# Patient Record
Sex: Male | Born: 1980 | Race: Asian | Hispanic: No | Marital: Married | Smoking: Never smoker
Health system: Southern US, Community
[De-identification: ages and names within clinical notes are randomized; demographics above are authoritative.]

---

## 2015-09-11 ENCOUNTER — Ambulatory Visit (INDEPENDENT_AMBULATORY_CARE_PROVIDER_SITE_OTHER): Payer: BLUE CROSS/BLUE SHIELD

## 2015-09-11 ENCOUNTER — Ambulatory Visit (INDEPENDENT_AMBULATORY_CARE_PROVIDER_SITE_OTHER): Payer: BLUE CROSS/BLUE SHIELD | Admitting: Family Medicine

## 2015-09-11 VITALS — BP 109/69 | HR 64 | Temp 98.2°F | Resp 18 | Ht 69.0 in | Wt 153.0 lb

## 2015-09-11 DIAGNOSIS — S9032XA Contusion of left foot, initial encounter: Secondary | ICD-10-CM | POA: Diagnosis not present

## 2015-09-11 NOTE — Progress Notes (Signed)
This chart was scribed for Elvina Sidle, MD by Stann Ore, medical scribe at [facility].The patient was seen in exam room 6 and the patient's care was started at 8:12 PM.  Patient ID: Brett Padilla MRN: [id number], DOB: 02/07/1981, 34 y.o. Date of Encounter: 09/11/2015  Primary Physician: No primary care provider on file.  Chief Complaint:  Chief Complaint  Patient presents with   Foot Injury    C/O left foot injury around 6:30 this evening-hit foot on post while on the lawnmower   Flu Vaccine    HPI:  Brett Padilla is a 34 y.o. male who presents to [facility] complaining of left foot injury around 6:30 PM. He was riding a lawnmower and hit his left foot on a post. He can stand on it. It also stings a little bit but he can walk.   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   No past medical history on file.   Home Meds: Prior to Admission medications   Not on File    Allergies: No Known Allergies  Social History   Social History   Marital Status: Married    Spouse Name: N/A   Number of Children: N/A   Years of Education: N/A   Occupational History   Not on file.   Social History Main Topics   Smoking status: Never Smoker    Smokeless tobacco: Not on file   Alcohol Use: Not on file   Drug Use: No   Sexual Activity: Not on file   Other Topics Concern   Not on file   Social History Narrative   No narrative on file     Review of Systems: Constitutional: negative for chills, fever, night sweats, weight changes, or fatigue  HEENT: negative for vision changes, hearing loss, congestion, rhinorrhea, ST, epistaxis, or sinus pressure Cardiovascular: negative for chest pain or palpitations Respiratory: negative for hemoptysis, wheezing, shortness of breath, or cough Abdominal: negative for abdominal pain, nausea, vomiting, diarrhea, or constipation Dermatological: positive for wound Neurologic: negative for  headache, dizziness, or syncope All other systems reviewed and are otherwise negative with the exception to those above and in the HPI.  Physical Exam: Blood pressure 109/69, pulse 64, temperature 98.2 F (36.8 C), temperature source Oral, resp. rate 18, height  (1.753 m), weight 153 lb (69.4 kg), SpO2 98 %., Body mass index is 22.58 kg/(m^2). General: Well developed, well nourished, in no acute distress. Head: Normocephalic, atraumatic, eyes without discharge, sclera non-icteric, nares are without discharge. Bilateral auditory canals clear, TM's are without perforation, pearly grey and translucent with reflective cone of light bilaterally. Oral cavity moist, posterior pharynx without exudate, erythema, peritonsillar abscess, or post nasal drip.  Neck: Supple. No thyromegaly. Full ROM. No lymphadenopathy. Lungs: Clear bilaterally to auscultation without wheezes, rales, or rhonchi. Breathing is unlabored. Heart: RRR with S1 S2. No murmurs, rubs, or gallops appreciated. Abdomen: Soft, non-tender, non-distended with normoactive bowel sounds. No hepatomegaly. No rebound/guarding. No obvious abdominal masses. Msk:  Strength and tone normal for age. Extremities/Skin: Warm and dry. No clubbing or cyanosis. Swelling over dorsal foot on big toe side with overlying 2cm abrasion Neuro: Alert and oriented X 3. Moves all extremities spontaneously. Gait is normal. CNII-XII grossly in tact. Psych:  Responds to questions appropriately with a normal affect.   Labs: [facility] reading (PRIMARY) by Dr. Milus Glazier : x-ray, left foot: no fracture   ASSESSMENT AND PLAN:  34 y.o. year old  male with contusion and abrasion left foot  Wash well, apply antibiotic ointment daily, return for any infection signs  This chart was scribed in my presence and reviewed by me personally.    ICD-9-CM ICD-10-CM   1. Foot contusion, left, initial encounter 924.20 S90.32XA DG Foot Complete Left    By signing my name below, I,  Stann Ore, attest that this documentation has been prepared under the direction and in the presence of Elvina Sidle, MD. Electronically Signed: Stann Ore, Scribe. 09/11/2015 , 8:14 PM .  Signed, Elvina Sidle, MD 09/11/2015 8:14 PM

## 2015-09-11 NOTE — Patient Instructions (Signed)
Keep the foot washed with soap and water daily followed by antibiotic ointment and cover with nonstick dressing. Usually this will remain swollen for up to a week with some discomfort when walking. Nevertheless I think you can go back to work tomorrow.  Please return if you see increasing redness, swelling, or pain.

## 2016-09-11 DIAGNOSIS — Z23 Encounter for immunization: Secondary | ICD-10-CM | POA: Diagnosis not present

## 2016-09-25 IMAGING — CR DG FOOT COMPLETE 3+V*L*
3 series · 3 of 3 positions shown · non-contrast
Comparison: None.

CLINICAL DATA: Status post foot injury today with left foot pain.

EXAM:
LEFT FOOT - COMPLETE 3+ VIEW

[AP]
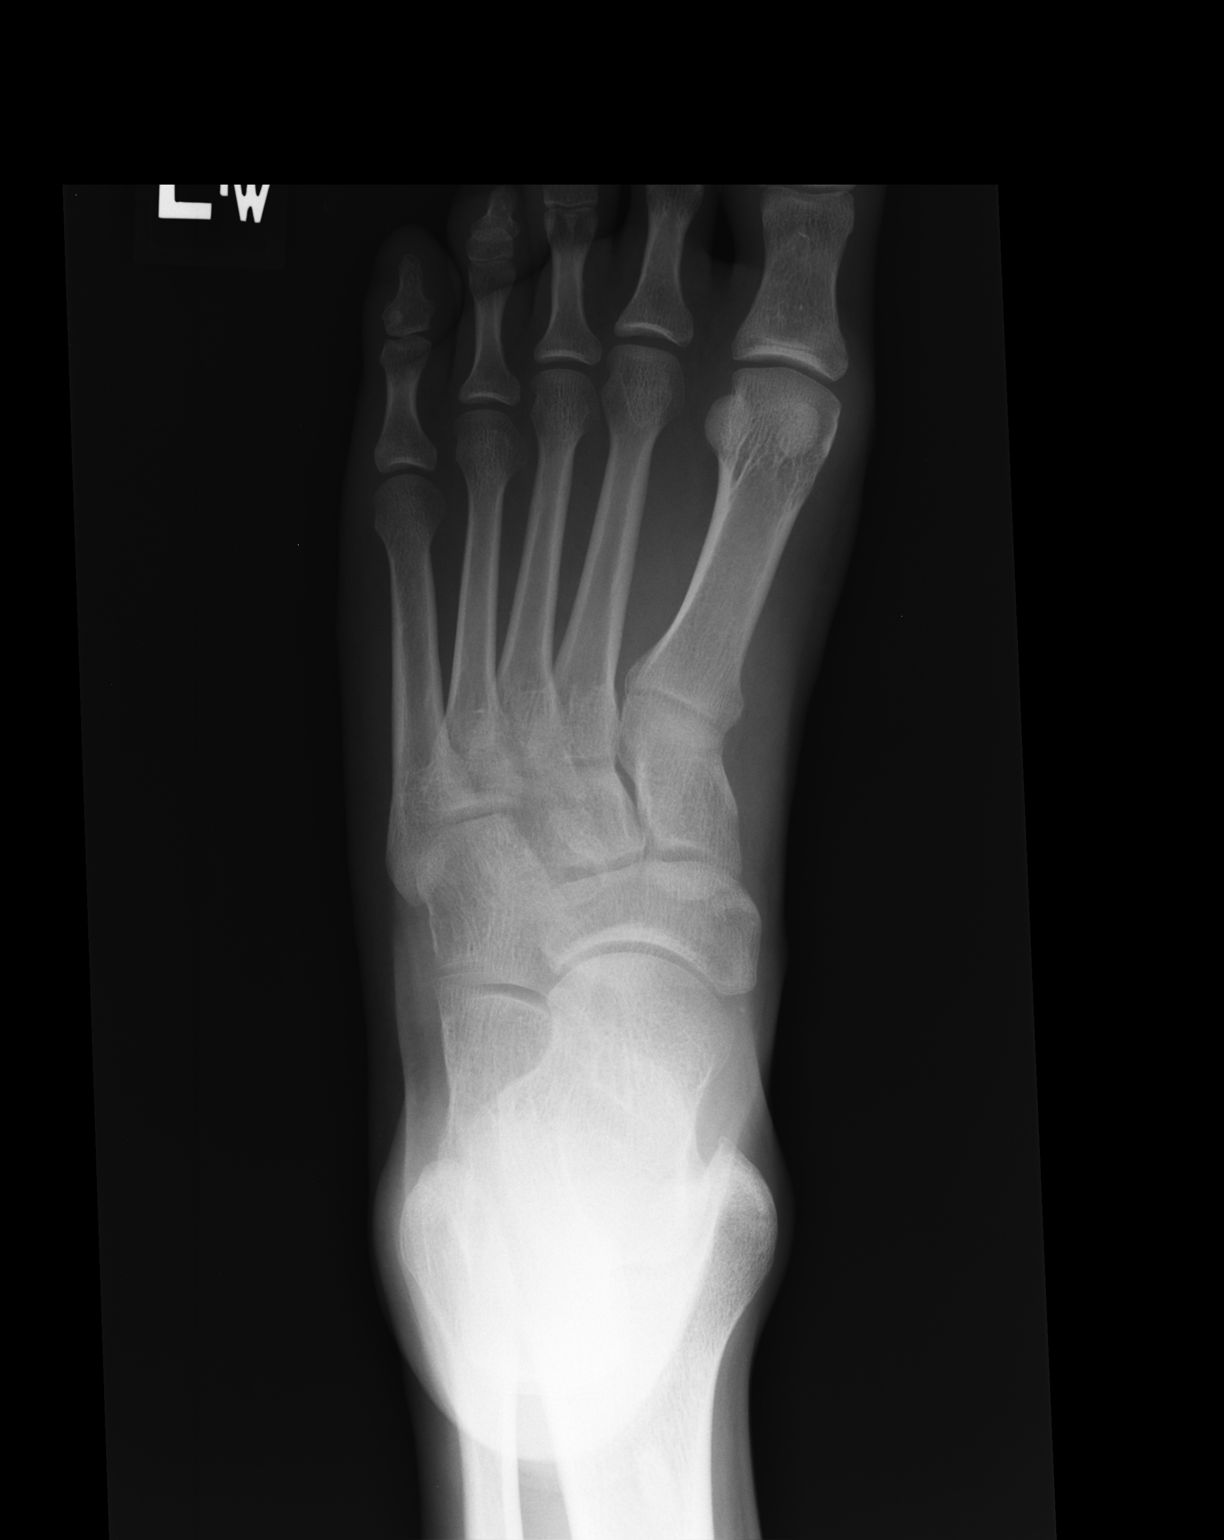

[ap obl int rot]
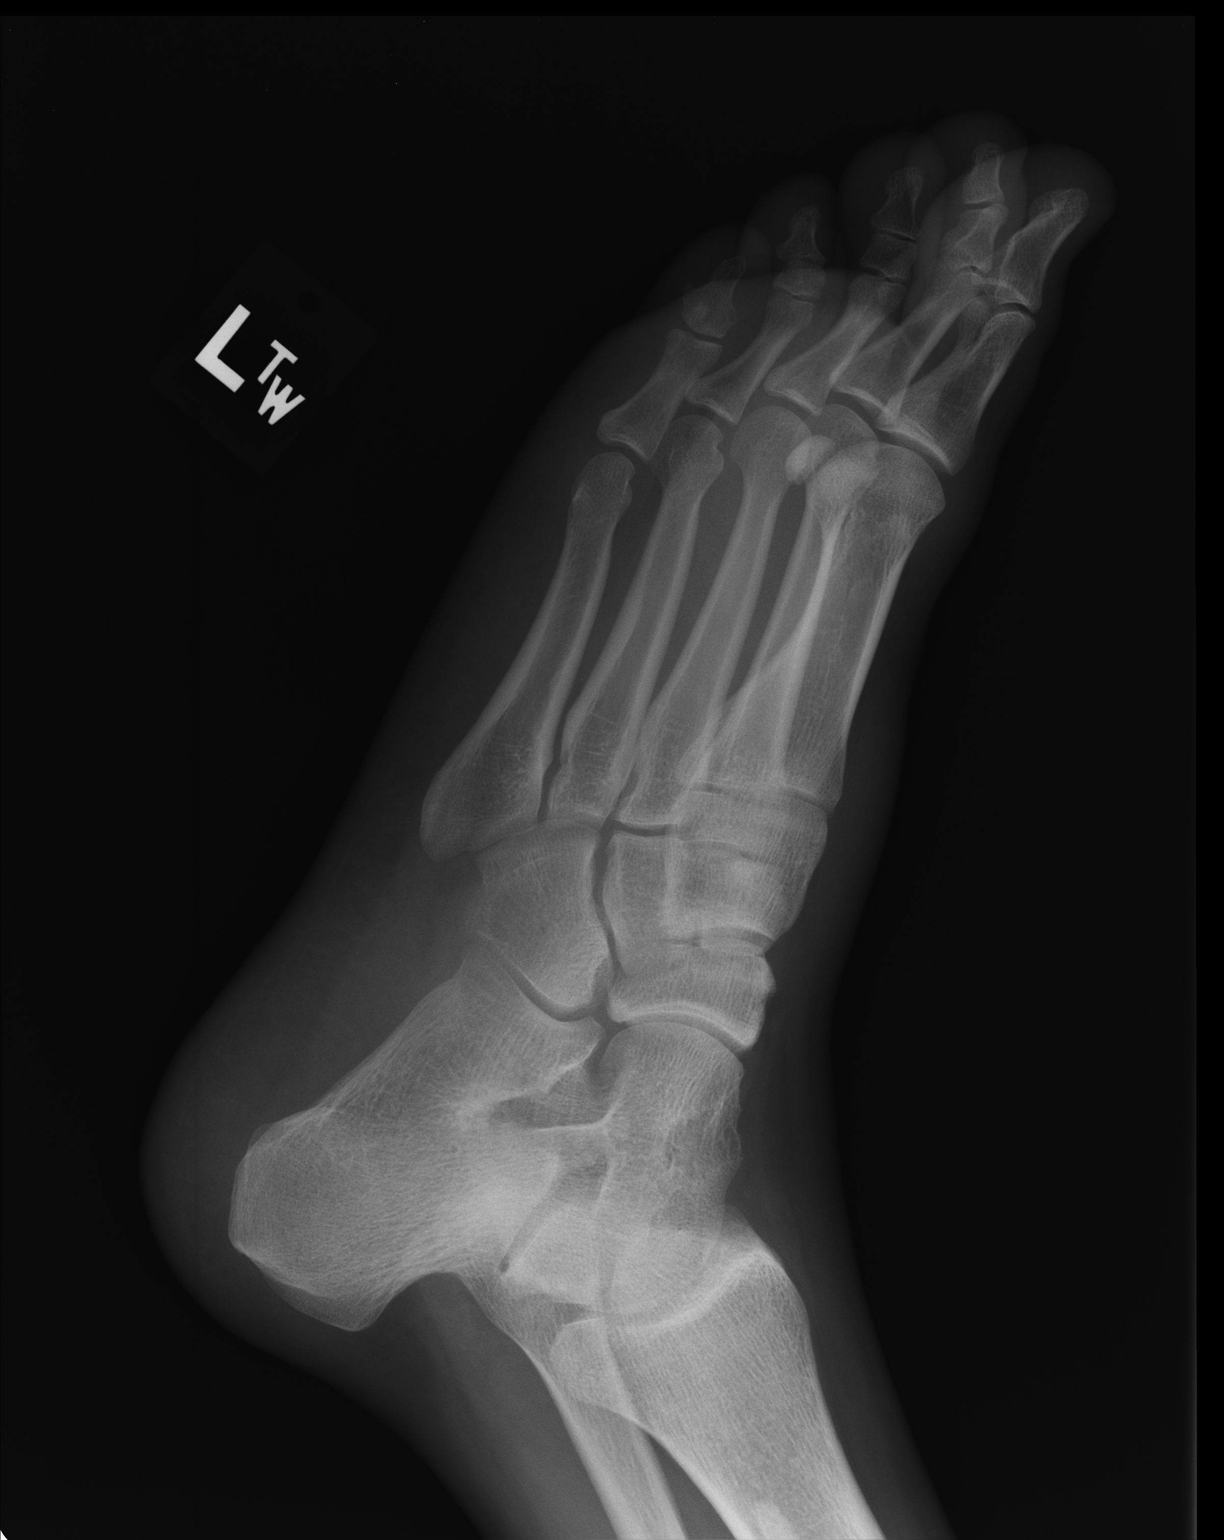

[lateral]
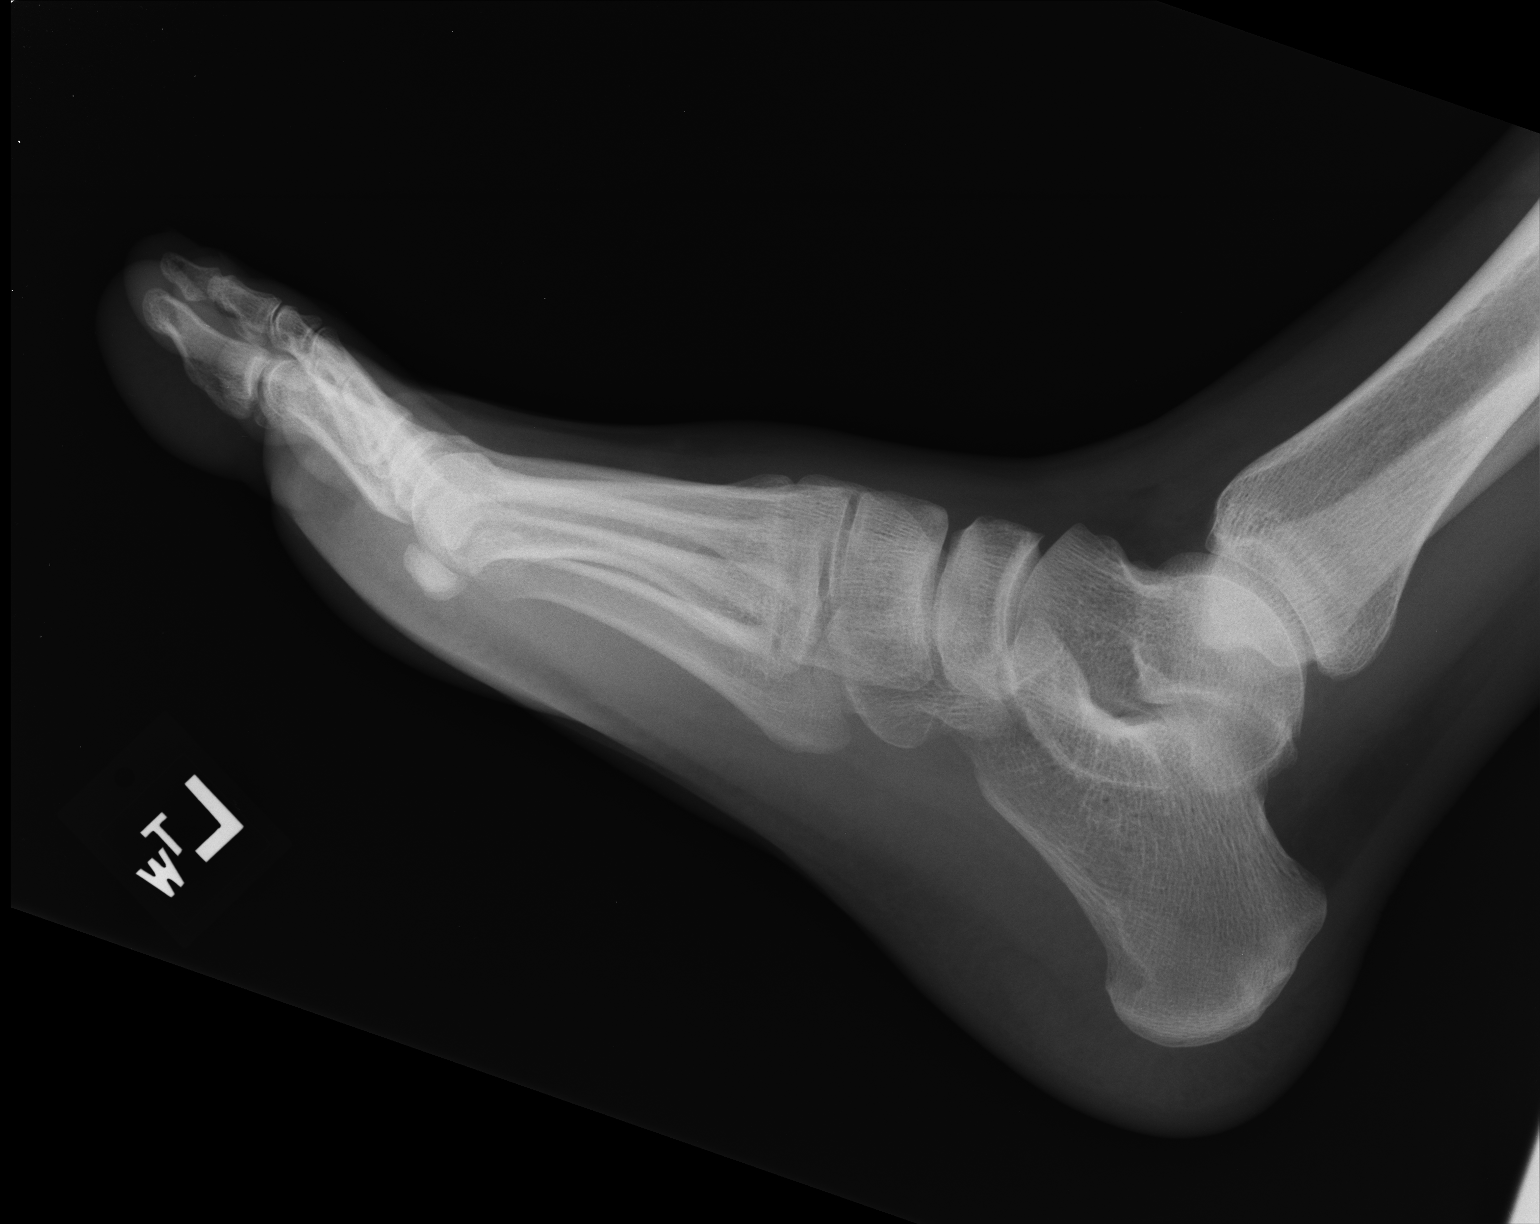

[3 of 3 positions shown; findings below may reference images not displayed]

FINDINGS: There is no evidence of fracture or dislocation. There is no
evidence of arthropathy or other focal bone abnormality. Soft
tissues are unremarkable.
IMPRESSION: Negative.

## 2017-02-27 ENCOUNTER — Ambulatory Visit: Payer: BLUE CROSS/BLUE SHIELD

## 2017-02-27 ENCOUNTER — Ambulatory Visit (INDEPENDENT_AMBULATORY_CARE_PROVIDER_SITE_OTHER): Payer: BLUE CROSS/BLUE SHIELD | Admitting: Emergency Medicine

## 2017-02-27 VITALS — BP 100/80 | HR 108 | Temp 99.0°F | Ht 69.1 in | Wt 159.0 lb

## 2017-02-27 DIAGNOSIS — R070 Pain in throat: Secondary | ICD-10-CM | POA: Diagnosis not present

## 2017-02-27 DIAGNOSIS — J020 Streptococcal pharyngitis: Secondary | ICD-10-CM | POA: Diagnosis not present

## 2017-02-27 DIAGNOSIS — J028 Acute pharyngitis due to other specified organisms: Secondary | ICD-10-CM | POA: Insufficient documentation

## 2017-02-27 LAB — POCT RAPID STREP A (OFFICE): Rapid Strep A Screen: POSITIVE — AB

## 2017-02-27 MED ORDER — AZITHROMYCIN 250 MG PO TABS: ORAL_TABLET | ORAL | 0 refills | Status: AC

## 2017-02-27 NOTE — Patient Instructions (Addendum)
IF you received an x-ray today, you will receive an invoice from [facility]. Please contact [facility] at [phone] with questions or concerns regarding your invoice.   IF you received labwork today, you will receive an invoice from [place]. Please contact [facility] at [phone] with questions or concerns regarding your invoice.   Our billing staff will not be able to assist you with questions regarding bills from these companies.  You will be contacted with the lab results as soon as they are available. The fastest way to get your results is to activate your My Chart account. Instructions are located on the last page of this paperwork. If you have not heard from [place] regarding the results in 2 weeks, please contact this office.      Sore Throat When you have a sore throat, your throat may:  Hurt.  Burn.  Feel irritated.  Feel scratchy. Many things can cause a sore throat, including:  An infection.  Allergies.  Dryness in the air.  Smoke or pollution.  Gastroesophageal reflux disease (GERD).  A tumor. A sore throat can be the first sign of another sickness. It can happen with other problems, like coughing or a fever. Most sore throats go away without treatment. Follow these instructions at home:  Take over-the-counter medicines only as told by your doctor.  Drink enough fluids to keep your pee (urine) clear or pale yellow.  Rest when you feel you need to.  To help with pain, try:  Sipping warm liquids, such as broth, herbal tea, or warm water.  Eating or drinking cold or frozen liquids, such as frozen ice pops.  Gargling with a salt-water mixture 3-4 times a day or as needed. To make a salt-water mixture, add -1 tsp of salt in 1 cup of warm water. Mix it until you cannot see the salt anymore.  Sucking on hard candy or throat lozenges.  Putting a cool-mist humidifier in your bedroom at night.  Sitting in the bathroom with the door  closed for 5-10 minutes while you run hot water in the shower.  Do not use any tobacco products, such as cigarettes, chewing tobacco, and e-cigarettes. If you need help quitting, ask your doctor. Contact a doctor if:  You have a fever for more than 2-3 days.  You keep having symptoms for more than 2-3 days.  Your throat does not get better in 7 days.  You have a fever and your symptoms suddenly get worse. Get help right away if:  You have trouble breathing.  You cannot swallow fluids, soft foods, or your saliva.  You have swelling in your throat or neck that gets worse.  You keep feeling like you are going to throw up (vomit).  You keep throwing up. This information is not intended to replace advice given to you by your health care provider. Make sure you discuss any questions you have with your health care provider. Document Released: 09/10/2008 Document Revised: 07/28/2016 Document Reviewed: 09/22/2015 Elsevier Interactive Patient Education  2017 Elsevier Inc.  Strep Throat Strep throat is an infection of the throat. It is caused by germs. Strep throat spreads from person to person because of coughing, sneezing, or close contact. Follow these instructions at home: Medicines   Take over-the-counter and prescription medicines only as told by your doctor.  Take your antibiotic medicine as told by your doctor. Do not stop taking the medicine even if you feel better.  Have family members who also have a  sore throat or fever go to a doctor. Eating and drinking   Do not share food, drinking cups, or personal items.  Try eating soft foods until your sore throat feels better.  Drink enough fluid to keep your pee (urine) clear or pale yellow. General instructions   Rinse your mouth (gargle) with a salt-water mixture 3-4 times per day or as needed. To make a salt-water mixture, stir -1 tsp of salt into 1 cup of warm water.  Make sure that all people in your house wash their  hands well.  Rest.  Stay home from school or work until you have been taking antibiotics for 24 hours.  Keep all follow-up visits as told by your doctor. This is important. Contact a doctor if:  Your neck keeps getting bigger.  You get a rash, cough, or earache.  You cough up thick liquid that is green, yellow-brown, or bloody.  You have pain that does not get better with medicine.  Your problems get worse instead of getting better.  You have a fever. Get help right away if:  You throw up (vomit).  You get a very bad headache.  You neck hurts or it feels stiff.  You have chest pain or you are short of breath.  You have drooling, very bad throat pain, or changes in your voice.  Your neck is swollen or the skin gets red and tender.  Your mouth is dry or you are peeing less than normal.  You keep feeling more tired or it is hard to wake up.  Your joints are red or they hurt. This information is not intended to replace advice given to you by your health care provider. Make sure you discuss any questions you have with your health care provider. Document Released: 05/20/2008 Document Revised: 07/31/2016 Document Reviewed: 03/27/2015 Elsevier Interactive Patient Education  2017 [employer]

## 2017-02-27 NOTE — Progress Notes (Signed)
Brett Padilla 36 y.o.   Chief Complaint  Patient presents with  . Sore Throat    x 1 day.  Wife dx with strep 1 week ago  . Fatigue  . Chills    HISTORY OF PRESENT ILLNESS: This is a 36 y.o. male complaining of sore throat for several days.  Sore Throat   This is a new problem. The current episode started in the past 7 days. The problem has been gradually worsening. There has been no fever. The pain is moderate. Associated symptoms include trouble swallowing. Pertinent negatives include no abdominal pain, congestion, coughing, diarrhea, ear pain, headaches, neck pain, shortness of breath, swollen glands or vomiting. Associated symptoms comments: Chills and fatigue. He has had exposure to strep (wife).     Prior to Admission medications   Medication Sig Start Date End Date Taking? Authorizing Provider  azithromycin (ZITHROMAX) 250 MG tablet Sig as indicated 02/27/17   Georgina QuintMiguel Jose Julio Zappia, MD    Not on File  Patient Active Problem List   Diagnosis Date Noted  . Acute pharyngitis due to other specified organisms 02/27/2017    No past medical history on file.  No past surgical history on file.  Social History   Social History  . Marital status: Married    Spouse name: N/A  . Number of children: N/A  . Years of education: N/A   Occupational History  . Not on file.   Social History Main Topics  . Smoking status: Never Smoker  . Smokeless tobacco: Never Used  . Alcohol use Not on file  . Drug use: No  . Sexual activity: Not on file   Other Topics Concern  . Not on file   Social History Narrative  . No narrative on file    No family history on file.   Review of Systems  Constitutional: Positive for chills. Negative for fever and malaise/fatigue.  HENT: Positive for sore throat and trouble swallowing. Negative for congestion, ear pain, nosebleeds and sinus pain.   Eyes: Negative for discharge and redness.  Respiratory: Negative for cough, hemoptysis and  shortness of breath.   Cardiovascular: Negative for chest pain and palpitations.  Gastrointestinal: Negative for abdominal pain, diarrhea, nausea and vomiting.  Genitourinary: Negative for dysuria and hematuria.  Musculoskeletal: Negative for myalgias and neck pain.  Skin: Negative for rash.  Neurological: Negative for dizziness and headaches.  All other systems reviewed and are negative.  Vitals:   02/27/17 0934  BP: 100/80  Pulse: (!) 108  Temp: 99 F (37.2 C)     Physical Exam  Constitutional: He is oriented to person, place, and time. He appears well-developed and well-nourished.  HENT:  Head: Normocephalic and atraumatic.  Mouth/Throat: Uvula is midline. Oropharyngeal exudate and posterior oropharyngeal erythema present. No tonsillar abscesses.  Eyes: Conjunctivae and EOM are normal. Pupils are equal, round, and reactive to light.  Neck: Normal range of motion. No JVD present. No thyromegaly present.  Cardiovascular: Normal rate, regular rhythm and normal heart sounds.   Pulmonary/Chest: Effort normal and breath sounds normal.  Abdominal: Soft. Bowel sounds are normal. There is no tenderness.  Musculoskeletal: Normal range of motion.  Lymphadenopathy:    He has cervical adenopathy.  Neurological: He is alert and oriented to person, place, and time. No sensory deficit. He exhibits normal muscle tone.  Skin: Skin is warm and dry. Capillary refill takes less than 2 seconds. No rash noted.  Psychiatric: He has a normal mood and affect.  Vitals reviewed.  ASSESSMENT & PLAN: Brett Padilla was seen today for sore throat, fatigue and chills.  Diagnoses and all orders for this visit:  Acute streptococcal pharyngitis -     POCT rapid strep A  Throat pain in adult  Strep pharyngitis  Other orders -     azithromycin (ZITHROMAX) 250 MG tablet; Sig as indicated    Patient Instructions       IF you received an x-ray today, you will receive an invoice from [facility]  Radiology. Please contact [facility] at [phone] with questions or concerns regarding your invoice.   IF you received labwork today, you will receive an invoice from [place]. Please contact [facility] at [phone] with questions or concerns regarding your invoice.   Our billing staff will not be able to assist you with questions regarding bills from these companies.  You will be contacted with the lab results as soon as they are available. The fastest way to get your results is to activate your My Chart account. Instructions are located on the last page of this paperwork. If you have not heard from [place] regarding the results in 2 weeks, please contact this office.      Sore Throat When you have a sore throat, your throat may:  Hurt.  Burn.  Feel irritated.  Feel scratchy. Many things can cause a sore throat, including:  An infection.  Allergies.  Dryness in the air.  Smoke or pollution.  Gastroesophageal reflux disease (GERD).  A tumor. A sore throat can be the first sign of another sickness. It can happen with other problems, like coughing or a fever. Most sore throats go away without treatment. Follow these instructions at home:  Take over-the-counter medicines only as told by your doctor.  Drink enough fluids to keep your pee (urine) clear or pale yellow.  Rest when you feel you need to.  To help with pain, try:  Sipping warm liquids, such as broth, herbal tea, or warm water.  Eating or drinking cold or frozen liquids, such as frozen ice pops.  Gargling with a salt-water mixture 3-4 times a day or as needed. To make a salt-water mixture, add -1 tsp of salt in 1 cup of warm water. Mix it until you cannot see the salt anymore.  Sucking on hard candy or throat lozenges.  Putting a cool-mist humidifier in your bedroom at night.  Sitting in the bathroom with the door closed for 5-10 minutes while you run hot water in the shower.  Do not use any  tobacco products, such as cigarettes, chewing tobacco, and e-cigarettes. If you need help quitting, ask your doctor. Contact a doctor if:  You have a fever for more than 2-3 days.  You keep having symptoms for more than 2-3 days.  Your throat does not get better in 7 days.  You have a fever and your symptoms suddenly get worse. Get help right away if:  You have trouble breathing.  You cannot swallow fluids, soft foods, or your saliva.  You have swelling in your throat or neck that gets worse.  You keep feeling like you are going to throw up (vomit).  You keep throwing up. This information is not intended to replace advice given to you by your health care provider. Make sure you discuss any questions you have with your health care provider. Document Released: 09/10/2008 Document Revised: 07/28/2016 Document Reviewed: 09/22/2015 Elsevier Interactive Patient Education  2017 [employer] Inc.  Strep Throat Strep throat is an infection of the throat.  It is caused by germs. Strep throat spreads from person to person because of coughing, sneezing, or close contact. Follow these instructions at home: Medicines   Take over-the-counter and prescription medicines only as told by your doctor.  Take your antibiotic medicine as told by your doctor. Do not stop taking the medicine even if you feel better.  Have family members who also have a sore throat or fever go to a doctor. Eating and drinking   Do not share food, drinking cups, or personal items.  Try eating soft foods until your sore throat feels better.  Drink enough fluid to keep your pee (urine) clear or pale yellow. General instructions   Rinse your mouth (gargle) with a salt-water mixture 3-4 times per day or as needed. To make a salt-water mixture, stir -1 tsp of salt into 1 cup of warm water.  Make sure that all people in your house wash their hands well.  Rest.  Stay home from school or work until you have been taking  antibiotics for 24 hours.  Keep all follow-up visits as told by your doctor. This is important. Contact a doctor if:  Your neck keeps getting bigger.  You get a rash, cough, or earache.  You cough up thick liquid that is green, yellow-brown, or bloody.  You have pain that does not get better with medicine.  Your problems get worse instead of getting better.  You have a fever. Get help right away if:  You throw up (vomit).  You get a very bad headache.  You neck hurts or it feels stiff.  You have chest pain or you are short of breath.  You have drooling, very bad throat pain, or changes in your voice.  Your neck is swollen or the skin gets red and tender.  Your mouth is dry or you are peeing less than normal.  You keep feeling more tired or it is hard to wake up.  Your joints are red or they hurt. This information is not intended to replace advice given to you by your health care provider. Make sure you discuss any questions you have with your health care provider. Document Released: 05/20/2008 Document Revised: 07/31/2016 Document Reviewed: 03/27/2015 Elsevier Interactive Patient Education  2017 Elsevier Inc.      Edwina Barth, MD [facility]

## 2017-06-02 DIAGNOSIS — Z1389 Encounter for screening for other disorder: Secondary | ICD-10-CM | POA: Diagnosis not present

## 2017-06-02 DIAGNOSIS — Z23 Encounter for immunization: Secondary | ICD-10-CM | POA: Diagnosis not present

## 2017-06-02 DIAGNOSIS — Z0000 Encounter for general adult medical examination without abnormal findings: Secondary | ICD-10-CM | POA: Diagnosis not present

## 2018-06-23 DIAGNOSIS — M25579 Pain in unspecified ankle and joints of unspecified foot: Secondary | ICD-10-CM | POA: Diagnosis not present

## 2018-06-23 DIAGNOSIS — Z6823 Body mass index (BMI) 23.0-23.9, adult: Secondary | ICD-10-CM | POA: Diagnosis not present

## 2018-07-14 DIAGNOSIS — Z0000 Encounter for general adult medical examination without abnormal findings: Secondary | ICD-10-CM | POA: Diagnosis not present

## 2018-07-21 DIAGNOSIS — Z1331 Encounter for screening for depression: Secondary | ICD-10-CM | POA: Diagnosis not present

## 2018-07-21 DIAGNOSIS — Z0000 Encounter for general adult medical examination without abnormal findings: Secondary | ICD-10-CM | POA: Diagnosis not present

## 2018-07-21 DIAGNOSIS — Z6823 Body mass index (BMI) 23.0-23.9, adult: Secondary | ICD-10-CM | POA: Diagnosis not present

## 2018-09-16 DIAGNOSIS — Z23 Encounter for immunization: Secondary | ICD-10-CM | POA: Diagnosis not present

## 2018-10-15 DIAGNOSIS — J0110 Acute frontal sinusitis, unspecified: Secondary | ICD-10-CM | POA: Diagnosis not present

## 2018-10-15 DIAGNOSIS — Z6823 Body mass index (BMI) 23.0-23.9, adult: Secondary | ICD-10-CM | POA: Diagnosis not present

## 2019-08-03 DIAGNOSIS — Z23 Encounter for immunization: Secondary | ICD-10-CM | POA: Diagnosis not present

## 2019-08-03 DIAGNOSIS — Z0000 Encounter for general adult medical examination without abnormal findings: Secondary | ICD-10-CM | POA: Diagnosis not present

## 2019-08-10 DIAGNOSIS — Z1331 Encounter for screening for depression: Secondary | ICD-10-CM | POA: Diagnosis not present

## 2019-08-10 DIAGNOSIS — Z0000 Encounter for general adult medical examination without abnormal findings: Secondary | ICD-10-CM | POA: Diagnosis not present

## 2019-12-21 ENCOUNTER — Ambulatory Visit: Payer: Self-pay | Attending: Internal Medicine

## 2019-12-21 DIAGNOSIS — Z20822 Contact with and (suspected) exposure to covid-19: Secondary | ICD-10-CM | POA: Insufficient documentation

## 2019-12-23 LAB — NOVEL CORONAVIRUS, NAA: SARS-CoV-2, NAA: NOT DETECTED

## 2020-08-23 DIAGNOSIS — Z03818 Encounter for observation for suspected exposure to other biological agents ruled out: Secondary | ICD-10-CM | POA: Diagnosis not present

## 2020-08-30 DIAGNOSIS — Z0000 Encounter for general adult medical examination without abnormal findings: Secondary | ICD-10-CM | POA: Diagnosis not present

## 2020-09-06 DIAGNOSIS — R1032 Left lower quadrant pain: Secondary | ICD-10-CM | POA: Diagnosis not present

## 2020-09-06 DIAGNOSIS — Z23 Encounter for immunization: Secondary | ICD-10-CM | POA: Diagnosis not present

## 2020-09-06 DIAGNOSIS — Z0000 Encounter for general adult medical examination without abnormal findings: Secondary | ICD-10-CM | POA: Diagnosis not present

## 2020-11-03 ENCOUNTER — Other Ambulatory Visit: Payer: Self-pay

## 2020-11-03 DIAGNOSIS — Z20822 Contact with and (suspected) exposure to covid-19: Secondary | ICD-10-CM

## 2020-11-04 LAB — SARS-COV-2, NAA 2 DAY TAT

## 2020-11-04 LAB — NOVEL CORONAVIRUS, NAA: SARS-CoV-2, NAA: NOT DETECTED

## 2021-10-10 DIAGNOSIS — Z0000 Encounter for general adult medical examination without abnormal findings: Secondary | ICD-10-CM | POA: Diagnosis not present

## 2021-10-17 DIAGNOSIS — Z0000 Encounter for general adult medical examination without abnormal findings: Secondary | ICD-10-CM | POA: Diagnosis not present

## 2021-10-17 DIAGNOSIS — Z23 Encounter for immunization: Secondary | ICD-10-CM | POA: Diagnosis not present

## 2022-10-31 DIAGNOSIS — E785 Hyperlipidemia, unspecified: Secondary | ICD-10-CM | POA: Diagnosis not present

## 2022-11-05 DIAGNOSIS — R82998 Other abnormal findings in urine: Secondary | ICD-10-CM | POA: Diagnosis not present

## 2022-11-05 DIAGNOSIS — Z0000 Encounter for general adult medical examination without abnormal findings: Secondary | ICD-10-CM | POA: Diagnosis not present

## 2022-11-05 DIAGNOSIS — Z23 Encounter for immunization: Secondary | ICD-10-CM | POA: Diagnosis not present

## 2022-11-05 DIAGNOSIS — E785 Hyperlipidemia, unspecified: Secondary | ICD-10-CM | POA: Diagnosis not present

## 2023-04-09 DIAGNOSIS — Z3009 Encounter for other general counseling and advice on contraception: Secondary | ICD-10-CM | POA: Diagnosis not present

## 2023-05-22 DIAGNOSIS — Z302 Encounter for sterilization: Secondary | ICD-10-CM | POA: Diagnosis not present

## 2023-11-14 DIAGNOSIS — E785 Hyperlipidemia, unspecified: Secondary | ICD-10-CM | POA: Diagnosis not present

## 2023-11-14 DIAGNOSIS — Z125 Encounter for screening for malignant neoplasm of prostate: Secondary | ICD-10-CM | POA: Diagnosis not present

## 2023-11-21 DIAGNOSIS — Z23 Encounter for immunization: Secondary | ICD-10-CM | POA: Diagnosis not present

## 2023-11-21 DIAGNOSIS — Z0000 Encounter for general adult medical examination without abnormal findings: Secondary | ICD-10-CM | POA: Diagnosis not present

## 2023-11-21 DIAGNOSIS — Z1331 Encounter for screening for depression: Secondary | ICD-10-CM | POA: Diagnosis not present

## 2023-11-21 DIAGNOSIS — Z1339 Encounter for screening examination for other mental health and behavioral disorders: Secondary | ICD-10-CM | POA: Diagnosis not present

## 2023-11-21 DIAGNOSIS — E785 Hyperlipidemia, unspecified: Secondary | ICD-10-CM | POA: Diagnosis not present

## 2023-11-21 DIAGNOSIS — R82998 Other abnormal findings in urine: Secondary | ICD-10-CM | POA: Diagnosis not present

## 2024-01-26 DIAGNOSIS — R051 Acute cough: Secondary | ICD-10-CM | POA: Diagnosis not present

## 2024-01-26 DIAGNOSIS — J101 Influenza due to other identified influenza virus with other respiratory manifestations: Secondary | ICD-10-CM | POA: Diagnosis not present

## 2024-11-23 DIAGNOSIS — Z0000 Encounter for general adult medical examination without abnormal findings: Secondary | ICD-10-CM | POA: Diagnosis not present

## 2024-11-23 DIAGNOSIS — Z23 Encounter for immunization: Secondary | ICD-10-CM | POA: Diagnosis not present

## 2024-11-23 DIAGNOSIS — Z1331 Encounter for screening for depression: Secondary | ICD-10-CM | POA: Diagnosis not present

## 2024-11-23 DIAGNOSIS — J309 Allergic rhinitis, unspecified: Secondary | ICD-10-CM | POA: Diagnosis not present

## 2024-11-23 DIAGNOSIS — Z1339 Encounter for screening examination for other mental health and behavioral disorders: Secondary | ICD-10-CM | POA: Diagnosis not present

## 2024-11-24 DIAGNOSIS — R82998 Other abnormal findings in urine: Secondary | ICD-10-CM | POA: Diagnosis not present
